# Patient Record
Sex: Male | Born: 1970 | Race: Black or African American | Hispanic: No | Marital: Single | Smoking: Current every day smoker
Health system: Southern US, Community
[De-identification: ages and names within clinical notes are randomized; demographics above are authoritative.]

## PROBLEM LIST (undated history)

## (undated) HISTORY — PX: APPENDECTOMY: SHX54

---

## 2015-12-30 ENCOUNTER — Emergency Department (HOSPITAL_COMMUNITY): Payer: Self-pay

## 2015-12-30 ENCOUNTER — Encounter (HOSPITAL_COMMUNITY): Payer: Self-pay | Admitting: *Deleted

## 2015-12-30 DIAGNOSIS — F17200 Nicotine dependence, unspecified, uncomplicated: Secondary | ICD-10-CM | POA: Insufficient documentation

## 2015-12-30 DIAGNOSIS — R079 Chest pain, unspecified: Secondary | ICD-10-CM | POA: Insufficient documentation

## 2015-12-30 DIAGNOSIS — Z5321 Procedure and treatment not carried out due to patient leaving prior to being seen by health care provider: Secondary | ICD-10-CM | POA: Insufficient documentation

## 2015-12-30 LAB — CBC
HCT: 41.3 % (ref 39.0–52.0)
Hemoglobin: 14.1 g/dL (ref 13.0–17.0)
MCH: 28.5 pg (ref 26.0–34.0)
MCHC: 34.1 g/dL (ref 30.0–36.0)
MCV: 83.4 fL (ref 78.0–100.0)
PLATELETS: 255 10*3/uL (ref 150–400)
RBC: 4.95 MIL/uL (ref 4.22–5.81)
RDW: 13.7 % (ref 11.5–15.5)
WBC: 10.5 10*3/uL (ref 4.0–10.5)

## 2015-12-30 LAB — BASIC METABOLIC PANEL
ANION GAP: 8 (ref 5–15)
BUN: 7 mg/dL (ref 6–20)
CHLORIDE: 108 mmol/L (ref 101–111)
CO2: 24 mmol/L (ref 22–32)
CREATININE: 0.87 mg/dL (ref 0.61–1.24)
Calcium: 8.7 mg/dL — ABNORMAL LOW (ref 8.9–10.3)
GFR calc non Af Amer: >60 mL/min (ref >60)
Glucose, Bld: 101 mg/dL — ABNORMAL HIGH (ref 65–99)
Potassium: 3.6 mmol/L (ref 3.5–5.1)
SODIUM: 140 mmol/L (ref 135–145)

## 2015-12-30 LAB — TROPONIN I

## 2015-12-30 NOTE — ED Triage Notes (Signed)
The pt is c/o chest pain for one month he arrived by gems from home  The pain is brought on by emotional outburst this afternoon/  The pain is getting better

## 2015-12-31 ENCOUNTER — Emergency Department (HOSPITAL_COMMUNITY)
Admission: EM | Admit: 2015-12-31 | Discharge: 2015-12-31 | Disposition: A | Discharge status: Home / Self Care | Payer: Self-pay | Attending: Dermatology | Admitting: Dermatology

## 2015-12-31 NOTE — ED Notes (Signed)
Pt st's he needs to leave because he has to go to work in the am.  Advise pt to stay.

## 2016-04-04 ENCOUNTER — Encounter: Payer: Self-pay | Admitting: Emergency Medicine

## 2016-04-04 ENCOUNTER — Emergency Department: Payer: Self-pay

## 2016-04-04 ENCOUNTER — Emergency Department
Admission: EM | Admit: 2016-04-04 | Discharge: 2016-04-04 | Disposition: A | Discharge status: Home / Self Care | Payer: Self-pay | Attending: Emergency Medicine | Admitting: Emergency Medicine

## 2016-04-04 DIAGNOSIS — F17200 Nicotine dependence, unspecified, uncomplicated: Secondary | ICD-10-CM | POA: Insufficient documentation

## 2016-04-04 DIAGNOSIS — K219 Gastro-esophageal reflux disease without esophagitis: Secondary | ICD-10-CM | POA: Insufficient documentation

## 2016-04-04 DIAGNOSIS — N390 Urinary tract infection, site not specified: Secondary | ICD-10-CM | POA: Insufficient documentation

## 2016-04-04 LAB — HEPATIC FUNCTION PANEL
ALBUMIN: 3.6 g/dL (ref 3.5–5.0)
ALK PHOS: 45 U/L (ref 38–126)
ALT: 22 U/L (ref 17–63)
AST: 20 U/L (ref 15–41)
BILIRUBIN TOTAL: 0.5 mg/dL (ref 0.3–1.2)
Total Protein: 6.4 g/dL — ABNORMAL LOW (ref 6.5–8.1)

## 2016-04-04 LAB — BASIC METABOLIC PANEL
ANION GAP: 5 (ref 5–15)
BUN: 9 mg/dL (ref 6–20)
CALCIUM: 8.8 mg/dL — AB (ref 8.9–10.3)
CO2: 24 mmol/L (ref 22–32)
Chloride: 110 mmol/L (ref 101–111)
Creatinine, Ser: 0.89 mg/dL (ref 0.61–1.24)
GLUCOSE: 106 mg/dL — AB (ref 65–99)
POTASSIUM: 3.8 mmol/L (ref 3.5–5.1)
SODIUM: 139 mmol/L (ref 135–145)

## 2016-04-04 LAB — URINALYSIS, COMPLETE (UACMP) WITH MICROSCOPIC
Bacteria, UA: NONE SEEN
Bilirubin Urine: NEGATIVE
GLUCOSE, UA: NEGATIVE mg/dL
Ketones, ur: NEGATIVE mg/dL
NITRITE: NEGATIVE
PH: 6 (ref 5.0–8.0)
Protein, ur: NEGATIVE mg/dL
Specific Gravity, Urine: 1.014 (ref 1.005–1.030)

## 2016-04-04 LAB — CBC
HEMATOCRIT: 48.4 % (ref 40.0–52.0)
HEMOGLOBIN: 16.4 g/dL (ref 13.0–18.0)
MCH: 28.1 pg (ref 26.0–34.0)
MCHC: 33.8 g/dL (ref 32.0–36.0)
MCV: 83.1 fL (ref 80.0–100.0)
Platelets: 288 10*3/uL (ref 150–440)
RBC: 5.83 MIL/uL (ref 4.40–5.90)
RDW: 14.1 % (ref 11.5–14.5)
WBC: 13.6 10*3/uL — AB (ref 3.8–10.6)

## 2016-04-04 LAB — LIPASE, BLOOD: Lipase: 27 U/L (ref 11–51)

## 2016-04-04 LAB — TROPONIN I

## 2016-04-04 MED ORDER — ALUMINUM-MAGNESIUM-SIMETHICONE 200-200-20 MG/5ML PO SUSP: 30.0000 mL | Freq: Three times a day (TID) | ORAL | 0 refills | Status: AC

## 2016-04-04 MED ORDER — CIPROFLOXACIN HCL 500 MG PO TABS: 500.0000 mg | ORAL_TABLET | Freq: Two times a day (BID) | ORAL | 0 refills | Status: AC

## 2016-04-04 MED ORDER — GI COCKTAIL ~~LOC~~
30.0000 mL | ORAL | Status: AC
  Administered 2016-04-04: 30 mL via ORAL
  Filled 2016-04-04: qty 30

## 2016-04-04 MED ORDER — RANITIDINE HCL 150 MG PO CAPS: 150.0000 mg | ORAL_CAPSULE | Freq: Two times a day (BID) | ORAL | 0 refills | Status: AC

## 2016-04-04 NOTE — ED Triage Notes (Signed)
Pt from home with chest/epigastric pain x 4 days and urinary symptoms x 5 days. States it burns to urinate, and it is dark in color.

## 2016-04-04 NOTE — ED Provider Notes (Signed)
[facility] Emergency Department Provider Note  ____________________________________________  Time seen: Approximately 3:48 PM  I have reviewed the triage vital signs and the nursing notes.   HISTORY  Chief Complaint Chest Pain and urinary symptoms    HPI Randall Simmons is a 45 y.o. male who complains of epigastric and lower central anterior chest tightness that only happens at night when he is laying down. Also has a funny feeling in his throat when this happens. Not exertional not pleuritic, been happening just the last 2 nights. At that it was probably acid reflux but wasn't sure. He's had a problem with that in the past and doesn't take any kind of antacids currently.  No vomiting diaphoresis or radiation or dizziness. No medical problems.  He does also report that for the past 2 weeks he's been having to urinate frequently with urgency. Denies dysuria.     History reviewed. No pertinent past medical history.   There are no active problems to display for this patient.    Past Surgical History:  Procedure Laterality Date  . APPENDECTOMY       Prior to Admission medications   Medication Sig Start Date End Date Taking? Authorizing Provider  aluminum-magnesium hydroxide-simethicone (MAALOX) 200-200-20 MG/5ML SUSP Take 30 mLs by mouth 4 (four) times daily -  before meals and at bedtime. 04/04/16   Sharman CheekPhillip Boyde Grieco, MD  ciprofloxacin (CIPRO) 500 MG tablet Take 1 tablet (500 mg total) by mouth 2 (two) times daily. 04/04/16   Sharman CheekPhillip Jasir Rother, MD  ranitidine (ZANTAC) 150 MG capsule Take 1 capsule (150 mg total) by mouth 2 (two) times daily. 04/04/16   Sharman CheekPhillip Savana Spina, MD     Allergies Patient has no known allergies.   History reviewed. No pertinent family history.  Social History Social History  Substance Use Topics  . Smoking status: Current Every Day Smoker    Packs/day: 0.50  . Smokeless tobacco: Current User  . Alcohol use No     Review of Systems  Constitutional:   No fever or chills.  ENT:   No sore throat. No rhinorrhea. Cardiovascular:   Positive as above chest pain. Respiratory:   No dyspnea or cough. Gastrointestinal:   Negative for abdominal pain, vomiting and diarrhea.  Genitourinary:   Positive frequent urination as well as urgency.. Musculoskeletal:   Negative for focal pain or swelling Neurological:   Negative for headaches 10-point ROS otherwise negative.  ____________________________________________   PHYSICAL EXAM:  VITAL SIGNS: ED Triage Vitals  Enc Vitals Group     BP 04/04/16 1130 (!) 124/91     Pulse Rate 04/04/16 1130 91     Resp 04/04/16 1130 18     Temp 04/04/16 1130 98.7 F (37.1 C)     Temp Source 04/04/16 1130 Oral     SpO2 04/04/16 1130 99 %     Weight 04/04/16 1132 255 lb (115.7 kg)     Height 04/04/16 1132 6\' 3"  (1.905 m)     Head Circumference --      Peak Flow --      Pain Score 04/04/16 1132 9     Pain Loc --      Pain Edu? --      Excl. in GC? --     Vital signs reviewed, nursing assessments reviewed.   Constitutional:   Alert and oriented. Well appearing and in no distress. Eyes:   No scleral icterus. No conjunctival pallor. PERRL. EOMI.  No nystagmus. ENT   Head:  Normocephalic and atraumatic.   Nose:   No congestion/rhinnorhea. No septal hematoma   Mouth/Throat:   MMM, no pharyngeal erythema. No peritonsillar mass.    Neck:   No stridor. No SubQ emphysema. No meningismus. Hematological/Lymphatic/Immunilogical:   No cervical lymphadenopathy. Cardiovascular:   RRR. Symmetric bilateral radial and DP pulses.  No murmurs.  Respiratory:   Normal respiratory effort without tachypnea nor retractions. Breath sounds are clear and equal bilaterally. No wheezes/rales/rhonchi.Anterior chest wall is tender in the area of indicated pain which reproduces his symptoms around the lower sternum. Gastrointestinal:   Soft and nontender. Non distended. There is  no CVA tenderness.  No rebound, rigidity, or guarding. Genitourinary:   deferred Musculoskeletal:   Nontender with normal range of motion in all extremities. No joint effusions.  No lower extremity tenderness.  No edema. Neurologic:   Normal speech and language.  CN 2-10 normal. Motor grossly intact. No gross focal neurologic deficits are appreciated.  Skin:    Skin is warm, dry and intact. No rash noted.  No petechiae, purpura, or bullae.  ____________________________________________    LABS (pertinent positives/negatives) (all labs ordered are listed, but only abnormal results are displayed) Labs Reviewed  BASIC METABOLIC PANEL - Abnormal; Notable for the following:       Result Value   Glucose, Bld 106 (*)    Calcium 8.8 (*)    All other components within normal limits  CBC - Abnormal; Notable for the following:    WBC 13.6 (*)    All other components within normal limits  URINALYSIS, COMPLETE (UACMP) WITH MICROSCOPIC - Abnormal; Notable for the following:    Color, Urine YELLOW (*)    APPearance HAZY (*)    Hgb urine dipstick SMALL (*)    Leukocytes, UA LARGE (*)    Squamous Epithelial / LPF 0-5 (*)    All other components within normal limits  HEPATIC FUNCTION PANEL - Abnormal; Notable for the following:    Total Protein 6.4 (*)    Bilirubin, Direct <0.1 (*)    All other components within normal limits  URINE CULTURE  TROPONIN I  LIPASE, BLOOD   ____________________________________________   EKG  Interpreted by me  Date: 04/04/2016  Rate: 85  Rhythm: normal sinus rhythm  QRS Axis: normal  Intervals: normal  ST/T Wave abnormalities: normal  Conduction Disutrbances: none  Narrative Interpretation: unremarkable      ____________________________________________    RADIOLOGY  Chest x-ray unremarkable  ____________________________________________   PROCEDURES Procedures  ____________________________________________   INITIAL IMPRESSION /  ASSESSMENT AND PLAN / ED COURSE  Pertinent labs & imaging results that were available during my care of the patient were reviewed by me and considered in my medical decision making (see chart for details).  Patient presents with 2 main symptoms. #1 is the chest pain which is reproducible on exam and is either chest wall pain, or possibly GERD. We'll have him try antacids. Also has urinary complaints and a urinary tract infection on labs. Urine culture sent. No evidence of sepsis or pyelonephritis. Patient's well appearing, and comfortable.Considering the patient's symptoms, medical history, and physical examination today, I have low suspicion for cholecystitis or biliary pathology, pancreatitis, perforation or bowel obstruction, hernia, intra-abdominal abscess, AAA or dissection, volvulus or intussusception, mesenteric ischemia, or appendicitis.  No other complaints or historical factors to suggest that this is sexually transmitted infection. Have the patient take ciprofloxacin, follow up with primary care.     Clinical Course    ____________________________________________  FINAL CLINICAL IMPRESSION(S) / ED DIAGNOSES  Final diagnoses:  Gastroesophageal reflux disease, esophagitis presence not specified  Lower urinary tract infection, acute      New Prescriptions   ALUMINUM-MAGNESIUM HYDROXIDE-SIMETHICONE (MAALOX) 200-200-20 MG/5ML SUSP    Take 30 mLs by mouth 4 (four) times daily -  before meals and at bedtime.   CIPROFLOXACIN (CIPRO) 500 MG TABLET    Take 1 tablet (500 mg total) by mouth 2 (two) times daily.   RANITIDINE (ZANTAC) 150 MG CAPSULE    Take 1 capsule (150 mg total) by mouth 2 (two) times daily.     Portions of this note were generated with dragon dictation software. Dictation errors may occur despite best attempts at proofreading.    Sharman Cheek, MD 04/04/16 [phone]

## 2016-04-06 LAB — URINE CULTURE

## 2017-09-30 IMAGING — CR DG CHEST 2V
2 series · 2 of 2 positions shown · non-contrast
Comparison: 12/30/2015

CLINICAL DATA: Central chest pain over the past few days

EXAM:
CHEST  2 VIEW

[chest pa]
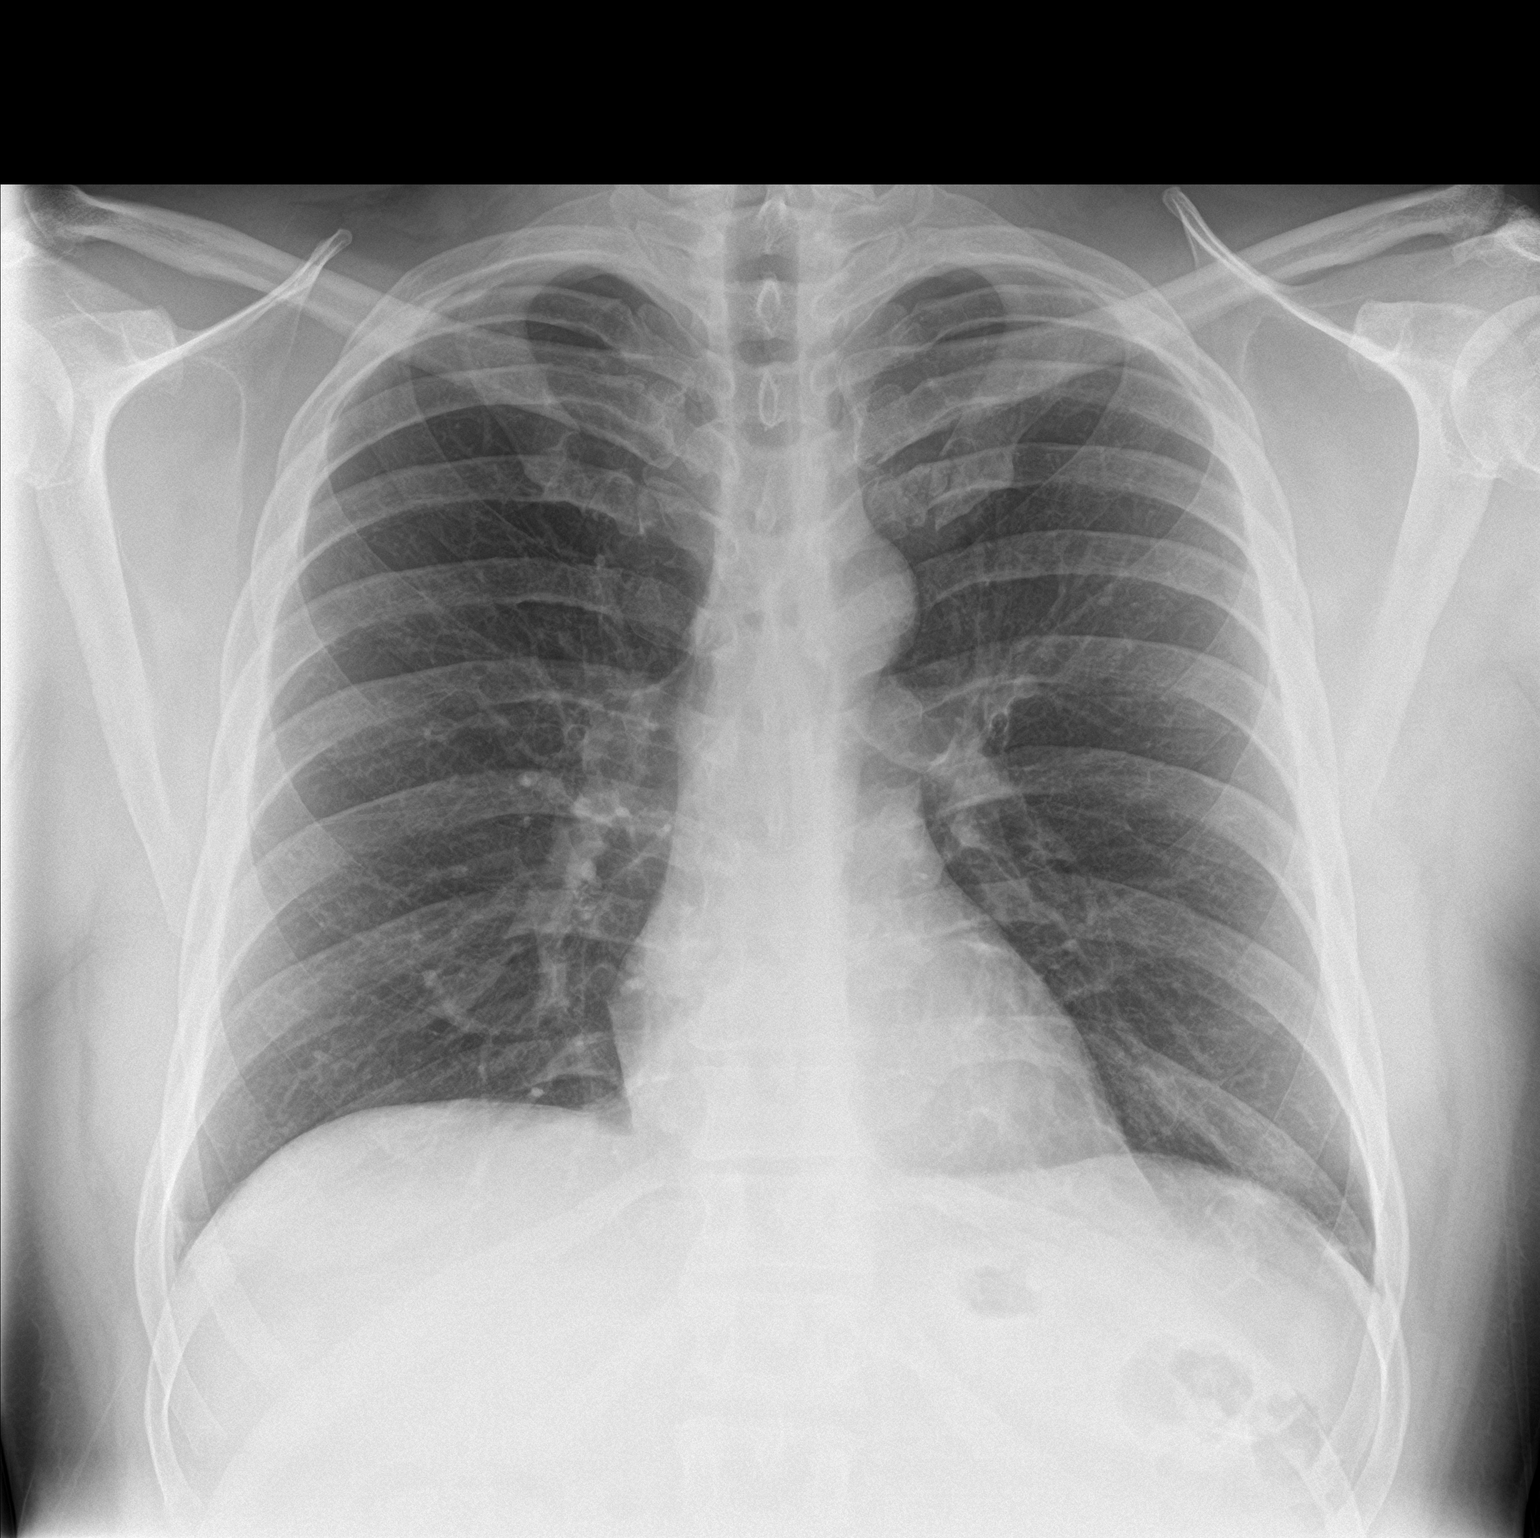

[chest lat]
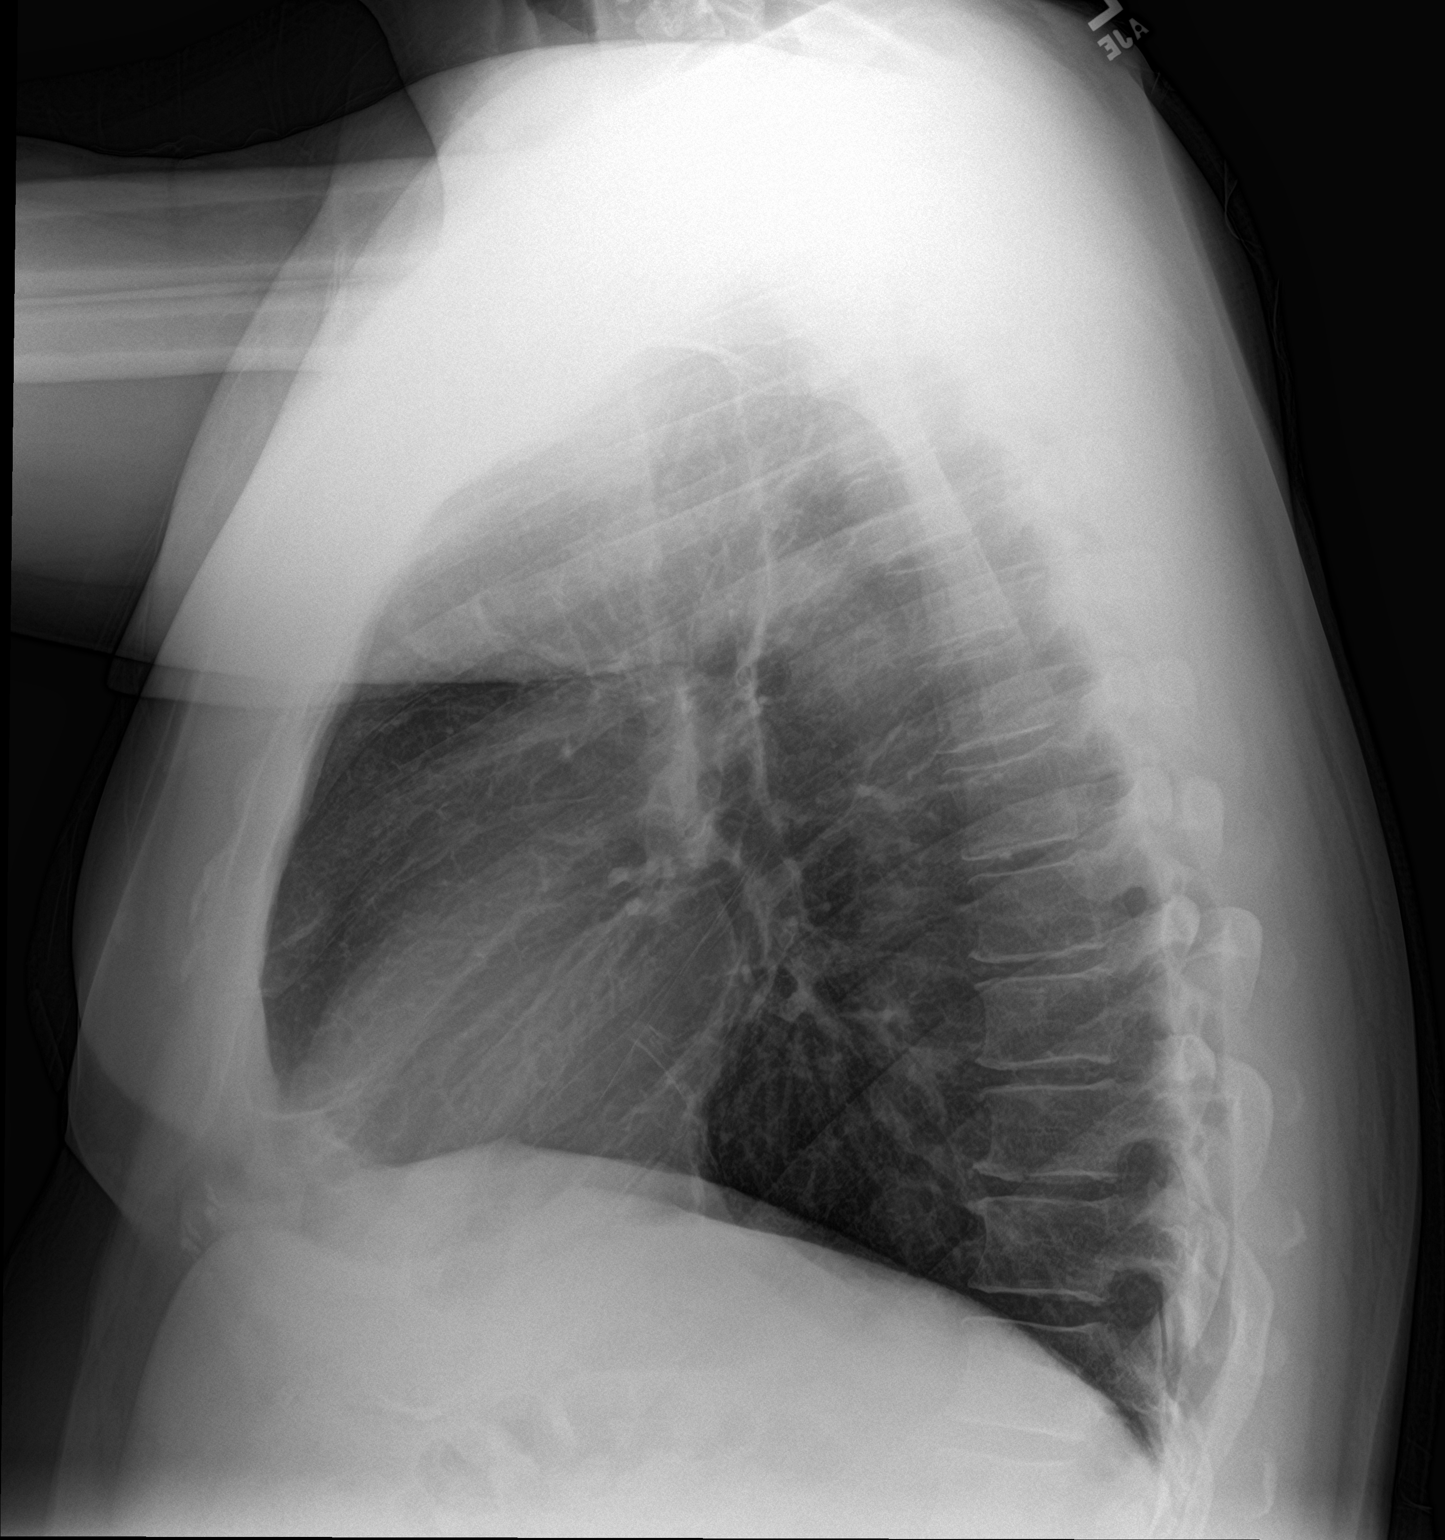

[2 of 2 positions shown; findings below may reference images not displayed]

FINDINGS: The heart size and mediastinal contours are within normal limits.
Both lungs are clear. The visualized skeletal structures are
unremarkable.
IMPRESSION: No active cardiopulmonary disease.
